# Patient Record
Sex: Female | Born: 1965 | Race: Asian | Hispanic: No | Marital: Married | Smoking: Never smoker
Health system: Southern US, Community
[De-identification: ages and names within clinical notes are randomized; demographics above are authoritative.]

---

## 2008-09-04 ENCOUNTER — Encounter: Admission: RE | Admit: 2008-09-04 | Discharge: 2008-09-04 | Payer: Self-pay | Admitting: Family Medicine

## 2010-09-26 ENCOUNTER — Other Ambulatory Visit: Payer: Self-pay | Admitting: Family Medicine

## 2010-09-26 DIAGNOSIS — Z1231 Encounter for screening mammogram for malignant neoplasm of breast: Secondary | ICD-10-CM

## 2010-09-26 DIAGNOSIS — Z139 Encounter for screening, unspecified: Secondary | ICD-10-CM

## 2010-10-24 ENCOUNTER — Ambulatory Visit: Admission: RE | Admit: 2010-10-24 | Payer: Self-pay | Source: Ambulatory Visit

## 2010-10-24 ENCOUNTER — Ambulatory Visit
Admission: RE | Admit: 2010-10-24 | Discharge: 2010-10-24 | Disposition: A | Discharge status: Home / Self Care | Payer: Managed Care, Other (non HMO) | Source: Ambulatory Visit | Attending: Family Medicine | Admitting: Family Medicine

## 2010-10-24 DIAGNOSIS — Z139 Encounter for screening, unspecified: Secondary | ICD-10-CM

## 2011-10-19 ENCOUNTER — Other Ambulatory Visit: Payer: Self-pay | Admitting: Family Medicine

## 2011-10-19 DIAGNOSIS — Z0000 Encounter for general adult medical examination without abnormal findings: Secondary | ICD-10-CM

## 2011-11-04 ENCOUNTER — Ambulatory Visit
Admission: RE | Admit: 2011-11-04 | Discharge: 2011-11-04 | Disposition: A | Discharge status: Home / Self Care | Payer: Managed Care, Other (non HMO) | Source: Ambulatory Visit | Attending: Family Medicine | Admitting: Family Medicine

## 2011-11-04 DIAGNOSIS — Z0000 Encounter for general adult medical examination without abnormal findings: Secondary | ICD-10-CM

## 2012-10-20 ENCOUNTER — Ambulatory Visit (INDEPENDENT_AMBULATORY_CARE_PROVIDER_SITE_OTHER): Payer: Managed Care, Other (non HMO) | Admitting: Radiology

## 2012-10-20 DIAGNOSIS — Z23 Encounter for immunization: Secondary | ICD-10-CM

## 2012-11-01 ENCOUNTER — Other Ambulatory Visit: Payer: Self-pay | Admitting: Family Medicine

## 2012-11-01 ENCOUNTER — Other Ambulatory Visit: Payer: Self-pay | Admitting: Nurse Practitioner

## 2012-11-01 DIAGNOSIS — Z139 Encounter for screening, unspecified: Secondary | ICD-10-CM

## 2012-11-17 ENCOUNTER — Ambulatory Visit
Admission: RE | Admit: 2012-11-17 | Discharge: 2012-11-17 | Disposition: A | Discharge status: Home / Self Care | Payer: Managed Care, Other (non HMO) | Source: Ambulatory Visit | Attending: Nurse Practitioner | Admitting: Nurse Practitioner

## 2012-11-17 DIAGNOSIS — Z139 Encounter for screening, unspecified: Secondary | ICD-10-CM

## 2012-11-22 ENCOUNTER — Other Ambulatory Visit: Payer: Self-pay | Admitting: Radiology

## 2013-11-02 ENCOUNTER — Other Ambulatory Visit: Payer: Self-pay | Admitting: Family Medicine

## 2013-11-02 DIAGNOSIS — Z0000 Encounter for general adult medical examination without abnormal findings: Secondary | ICD-10-CM

## 2013-11-15 ENCOUNTER — Ambulatory Visit
Admission: RE | Admit: 2013-11-15 | Discharge: 2013-11-15 | Disposition: A | Discharge status: Home / Self Care | Payer: Managed Care, Other (non HMO) | Source: Ambulatory Visit | Attending: Family Medicine | Admitting: Family Medicine

## 2013-11-15 DIAGNOSIS — Z0000 Encounter for general adult medical examination without abnormal findings: Secondary | ICD-10-CM

## 2014-11-12 ENCOUNTER — Other Ambulatory Visit: Payer: Self-pay | Admitting: Family Medicine

## 2014-11-12 DIAGNOSIS — Z139 Encounter for screening, unspecified: Secondary | ICD-10-CM

## 2014-12-26 ENCOUNTER — Ambulatory Visit
Admission: RE | Admit: 2014-12-26 | Discharge: 2014-12-26 | Disposition: A | Discharge status: Home / Self Care | Payer: Managed Care, Other (non HMO) | Source: Ambulatory Visit | Attending: Family Medicine | Admitting: Family Medicine

## 2014-12-26 DIAGNOSIS — Z139 Encounter for screening, unspecified: Secondary | ICD-10-CM

## 2015-09-16 ENCOUNTER — Encounter (HOSPITAL_COMMUNITY): Payer: Self-pay | Admitting: Emergency Medicine

## 2015-09-16 ENCOUNTER — Emergency Department (HOSPITAL_COMMUNITY)
Admission: EM | Admit: 2015-09-16 | Discharge: 2015-09-16 | Disposition: A | Discharge status: Home / Self Care | Payer: Managed Care, Other (non HMO) | Attending: Emergency Medicine | Admitting: Emergency Medicine

## 2015-09-16 DIAGNOSIS — R590 Localized enlarged lymph nodes: Secondary | ICD-10-CM | POA: Insufficient documentation

## 2015-09-16 DIAGNOSIS — R591 Generalized enlarged lymph nodes: Secondary | ICD-10-CM

## 2015-09-16 DIAGNOSIS — H9202 Otalgia, left ear: Secondary | ICD-10-CM | POA: Diagnosis present

## 2015-09-16 NOTE — ED Provider Notes (Signed)
[facility]-EMERGENCY DEPT Provider Note   CSN: [id number] Arrival date & time: 09/16/15  1127  By signing my name below, I, Linna Darner, attest that this documentation has been prepared under the direction and in the presence of non-physician practitioner, Jaynie Crumble, PA-C. Electronically Signed: Linna Darner, Scribe. 09/16/2015. 1:35 PM.  History   Chief Complaint Chief Complaint  Patient presents with  . Otalgia    The history is provided by the patient. No language interpreter was used.     HPI Comments: Cheryl Boyd is a 49 y.o. female who presents to the Emergency Department complaining of sudden onset, constant, pain behind her left ear beginning two days ago. She states she had a fever and cough beginning 5 days ago that resolved 2 days ago; she noticed a bump behind her left ear when these symptoms resolved. Pt states the area is painful and endorses pain exacerbation with palpation. Pt reports her fever was between 99 and 100 at the highest. She has taken tylenol for the pain behind her left ear; her last dose was last night. She denies sore throat, rhinorrhea, left ear pain, trouble swallowing, pain with swallowing, or any other associated symptoms. She notes she has a PCP.  History reviewed. No pertinent past medical history.  There are no active problems to display for this patient.   History reviewed. No pertinent surgical history.  OB History    No data available       Home Medications    Prior to Admission medications   Not on File    Family History No family history on file.  Social History Social History  Substance Use Topics  . Smoking status: Never Smoker  . Smokeless tobacco: Never Used  . Alcohol use 4.8 oz/week    4 Cans of beer, 4 Glasses of wine per week     Allergies   Review of patient's allergies indicates no known allergies.   Review of Systems Review of Systems  Constitutional: Positive for fever (resolved).  HENT:  Negative for ear pain (left), rhinorrhea, sore throat and trouble swallowing.   Respiratory: Positive for cough (resolved).   Musculoskeletal: Positive for arthralgias (behind left ear).    Physical Exam Updated Vital Signs BP 122/81   Pulse 88   Temp 98.6 F (37 C) (Oral)   Resp 18   Ht 5\' 6"  (1.676 m)   Wt 126 lb (57.2 kg)   LMP 09/05/2015   SpO2 100%   BMI 20.34 kg/m   Physical Exam  Constitutional: She is oriented to person, place, and time. She appears well-developed and well-nourished. No distress.  HENT:  Head: Normocephalic and atraumatic.  Eyes: Conjunctivae and EOM are normal.  Neck: Neck supple. No tracheal deviation present.  Mild left posterior cervical lymphadenopathy.   Cardiovascular: Normal rate, regular rhythm and normal heart sounds.   Pulmonary/Chest: Effort normal and breath sounds normal. No respiratory distress. She has no wheezes. She has no rales.  Musculoskeletal: Normal range of motion.  Neurological: She is alert and oriented to person, place, and time.  Skin: Skin is warm and dry.  Psychiatric: She has a normal mood and affect. Her behavior is normal.  Nursing note and vitals reviewed.   ED Treatments / Results  Labs (all labs ordered are listed, but only abnormal results are displayed) Labs Reviewed - No data to display  EKG  EKG Interpretation None       Radiology No results found.  Procedures Procedures (including critical care time)  DIAGNOSTIC STUDIES: Oxygen Saturation is 100% on RA, normal by my interpretation.    COORDINATION OF CARE: 1:35 PM Discussed treatment plan with pt at bedside and pt agreed to plan.  Medications Ordered in ED Medications - No data to display   Initial Impression / Assessment and Plan / ED Course  I have reviewed the triage vital signs and the nursing notes.  Pertinent labs & imaging results that were available during my care of the patient were reviewed by me and considered in my medical  decision making (see chart for details).  Clinical Course   Patient emergency department with pain to the left posterior neck, has some tenderness on exam over posterior cervical chain lymph nodes. Mild enlargement of the lymph nodes noted. Her exam otherwise is unremarkable. Her vital signs are normal. She is afebrile. No neck pain or stiffness otherwise. Patient did have some URI symptoms which now resolved including low-grade fever. Question possible viral upper respiratory tract infection. Instructed patient to keep an eye on the lymph nodes, take NSAIDs, follow with primary care doctor but improving. Pt agreed and voiced understanding.   Vitals:   09/16/15 1140 09/16/15 1141 09/16/15 1349  BP: 122/81  106/68  Pulse: 88  74  Resp: 18  18  Temp: 98.6 F (37 C)  98.7 F (37.1 C)  TempSrc: Oral  Oral  SpO2: 100%  99%  Weight:  57.2 kg   Height:  5\' 6"  (1.676 m)     I personally performed the services described in this documentation, which was scribed in my presence. The recorded information has been reviewed and is accurate.  Final Clinical Impressions(s) / ED Diagnoses   Final diagnoses:  Lymphadenopathy    New Prescriptions There are no discharge medications for this patient.    Jaynie Crumbleatyana Worth Kober, PA-C 09/16/15 1643    Derwood KaplanAnkit Nanavati, MD 09/17/15 [id number]

## 2015-09-16 NOTE — Discharge Instructions (Signed)
Take ibuprofen for pain and inflammation. Follow up with family doctor if not improving in 3-5 days.

## 2015-09-16 NOTE — ED Notes (Signed)
Patient is A & O x4.  She is ambulatory and has no questions regarding discharge instructions.

## 2015-09-16 NOTE — ED Triage Notes (Signed)
Pt reports right earache x 5 days, fever for which she sts took tylenol . Reports body aches and flu like symptoms.

## 2015-12-17 ENCOUNTER — Other Ambulatory Visit: Payer: Self-pay | Admitting: Family Medicine

## 2015-12-17 DIAGNOSIS — Z0000 Encounter for general adult medical examination without abnormal findings: Secondary | ICD-10-CM

## 2015-12-17 DIAGNOSIS — Z9189 Other specified personal risk factors, not elsewhere classified: Secondary | ICD-10-CM

## 2015-12-26 ENCOUNTER — Ambulatory Visit
Admission: RE | Admit: 2015-12-26 | Discharge: 2015-12-26 | Disposition: A | Discharge status: Home / Self Care | Payer: Managed Care, Other (non HMO) | Source: Ambulatory Visit | Attending: Family Medicine | Admitting: Family Medicine

## 2015-12-26 DIAGNOSIS — Z9189 Other specified personal risk factors, not elsewhere classified: Secondary | ICD-10-CM

## 2015-12-26 DIAGNOSIS — Z0000 Encounter for general adult medical examination without abnormal findings: Secondary | ICD-10-CM

## 2016-12-09 ENCOUNTER — Other Ambulatory Visit: Payer: Self-pay | Admitting: Family Medicine

## 2016-12-09 DIAGNOSIS — Z9189 Other specified personal risk factors, not elsewhere classified: Secondary | ICD-10-CM

## 2016-12-31 ENCOUNTER — Other Ambulatory Visit: Payer: Managed Care, Other (non HMO)

## 2017-01-26 ENCOUNTER — Ambulatory Visit
Admission: RE | Admit: 2017-01-26 | Discharge: 2017-01-26 | Disposition: A | Discharge status: Home / Self Care | Payer: Managed Care, Other (non HMO) | Source: Ambulatory Visit | Attending: Family Medicine | Admitting: Family Medicine

## 2017-01-26 ENCOUNTER — Other Ambulatory Visit: Payer: Managed Care, Other (non HMO)

## 2017-01-26 DIAGNOSIS — Z9189 Other specified personal risk factors, not elsewhere classified: Secondary | ICD-10-CM

## 2018-03-16 ENCOUNTER — Ambulatory Visit
Admission: RE | Admit: 2018-03-16 | Discharge: 2018-03-16 | Disposition: A | Discharge status: Home / Self Care | Payer: 59 | Source: Ambulatory Visit | Attending: Emergency Medicine | Admitting: Emergency Medicine

## 2018-03-16 ENCOUNTER — Other Ambulatory Visit: Payer: Self-pay | Admitting: Emergency Medicine

## 2018-03-16 DIAGNOSIS — R52 Pain, unspecified: Secondary | ICD-10-CM

## 2018-03-22 ENCOUNTER — Other Ambulatory Visit: Payer: Self-pay | Admitting: Emergency Medicine

## 2018-03-22 DIAGNOSIS — M25562 Pain in left knee: Secondary | ICD-10-CM

## 2018-03-26 ENCOUNTER — Other Ambulatory Visit: Payer: Self-pay

## 2018-03-26 ENCOUNTER — Ambulatory Visit
Admission: RE | Admit: 2018-03-26 | Discharge: 2018-03-26 | Disposition: A | Discharge status: Home / Self Care | Payer: 59 | Source: Ambulatory Visit | Attending: Emergency Medicine | Admitting: Emergency Medicine

## 2018-03-26 DIAGNOSIS — M25562 Pain in left knee: Secondary | ICD-10-CM

## 2020-02-20 IMAGING — MR MRI OF THE LEFT KNEE WITHOUT CONTRAST
6 series · 38 of 40 positions shown · non-contrast
Comparison: Radiographs 03/16/2018

CLINICAL DATA: Medial left knee pain for 1 month. Pain began after
playing tennis.

EXAM:
MRI OF THE LEFT KNEE WITHOUT CONTRAST
TECHNIQUE: Multiplanar, multisequence MR imaging of the knee was performed. No
intravenous contrast was administered.

[Series 6: T2 fat-sat · axial · left · 4.0mm · 0.50mm/px · z∈[-32,+121]mm · 9 of 36 slices shown (1 of 3)]
[im 1/36]
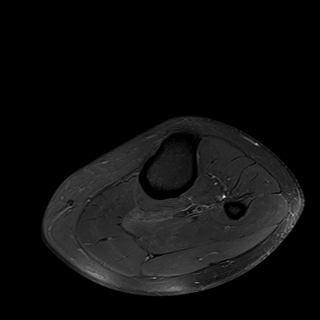
[im 5/36]
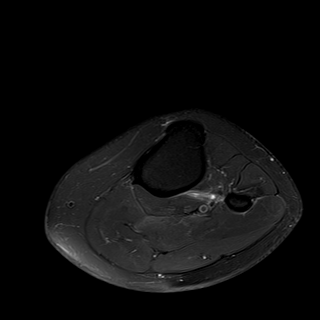
[im 9/36]
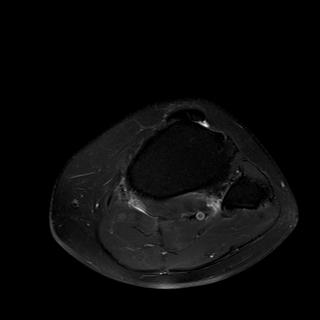
[im 14/36]
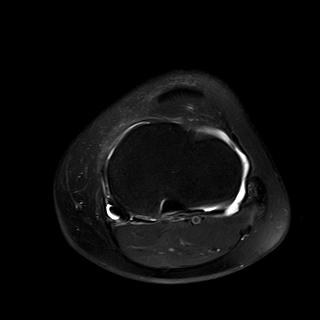
[im 18/36]
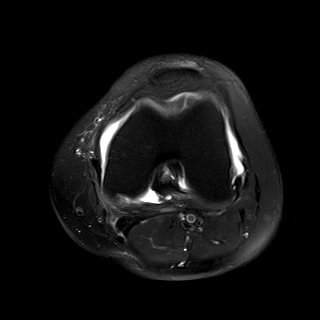
[im 22/36]
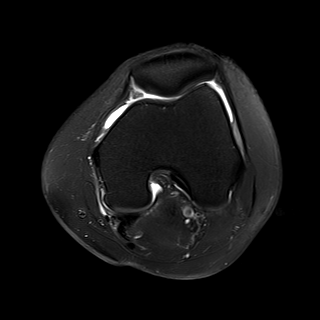
[im 27/36]
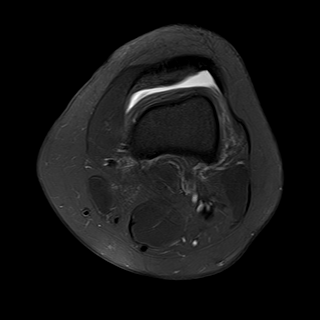
[im 31/36]
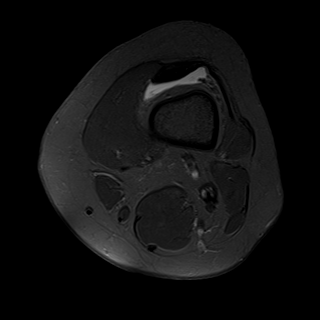
[im 36/36]
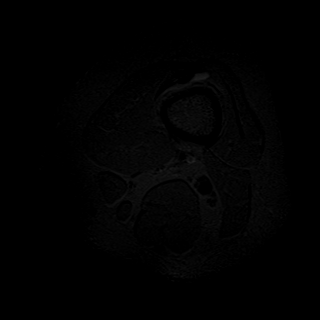

[Series 7: T2 fat-sat · coronal · left · 4.0mm · 0.39mm/px · 6 of 26 slices shown (2 of 3)]
[im 1/26]
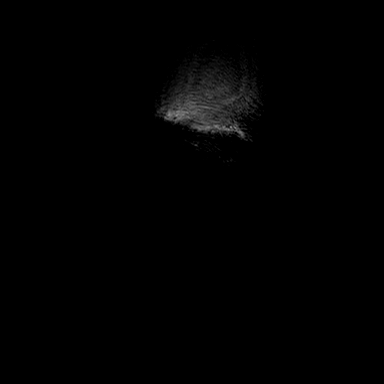
[im 6/26]
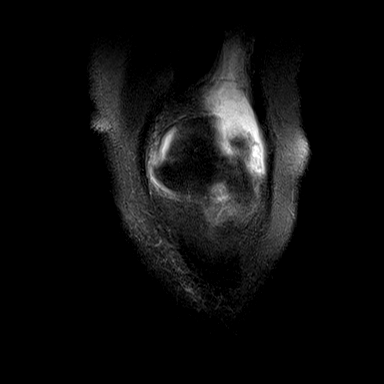
[im 11/26]
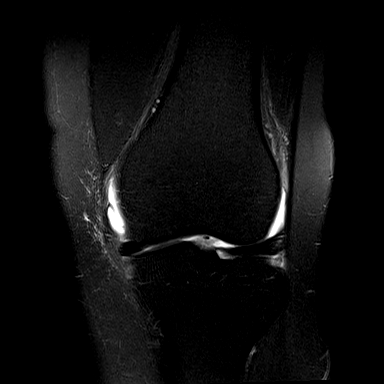
[im 16/26]
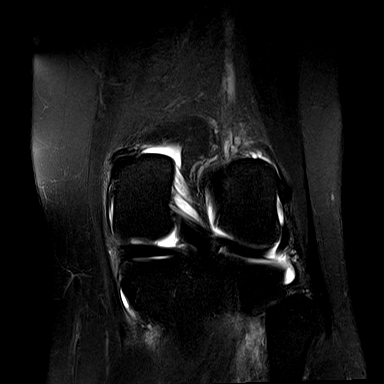
[im 21/26]
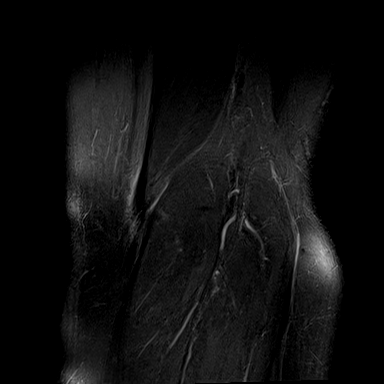
[im 26/26]
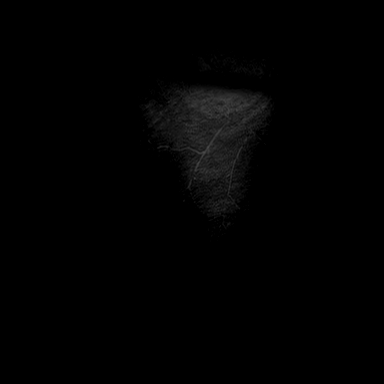

[Series 8: T1 · coronal · left · 4.0mm · 0.39mm/px · 4 of 26 slices shown]
[im 1/26]
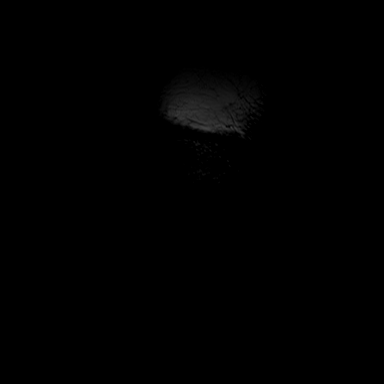
[im 6/26]
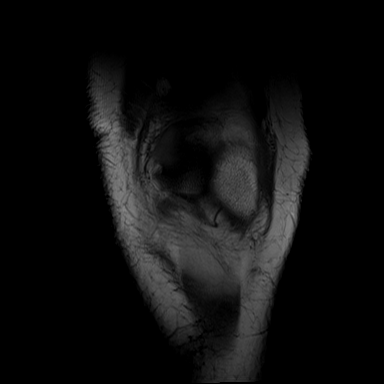
[im 11/26]
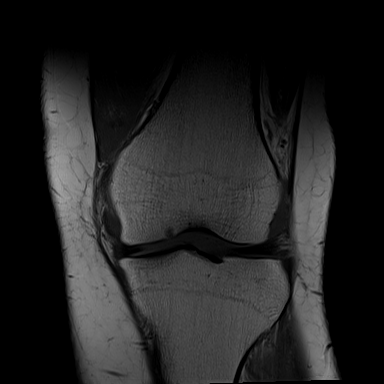
[im 16/26]
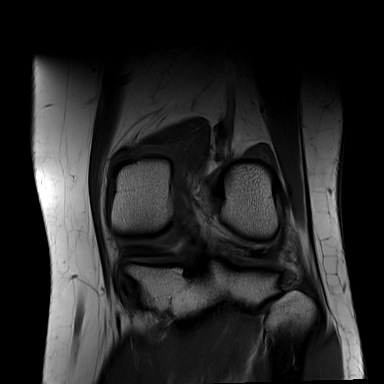

[Series 9: PD fat-sat · coronal · left · 3.0mm · 0.47mm/px · 7 of 30 slices shown (1 of 2)]
[im 1/30]
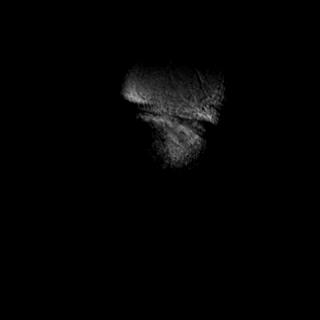
[im 5/30]
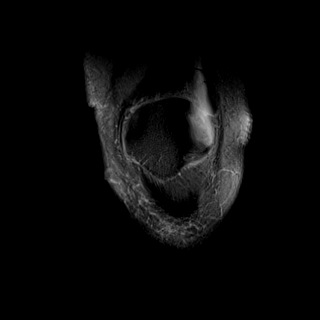
[im 10/30]
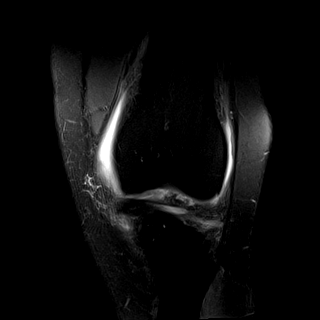
[im 15/30]
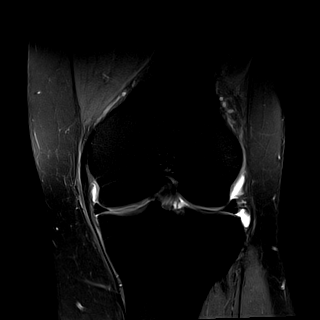
[im 20/30]
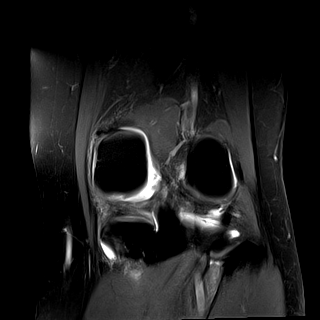
[im 25/30]
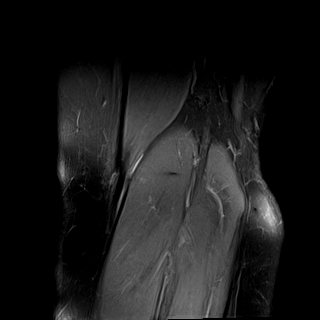
[im 30/30]
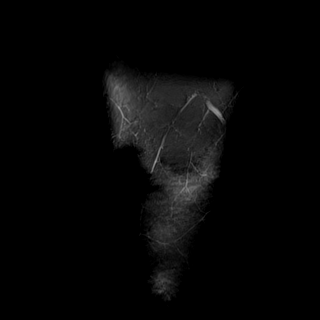

[Series 10: PD fat-sat · sagittal · left · 3.0mm · 0.39mm/px · 6 of 27 slices shown (2 of 2)]
[im 1/27]
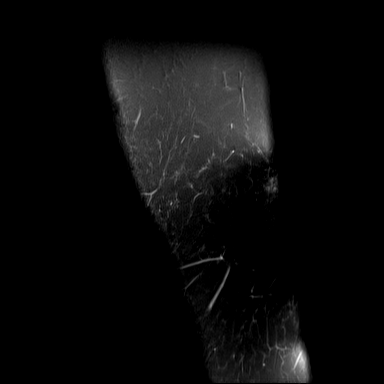
[im 6/27]
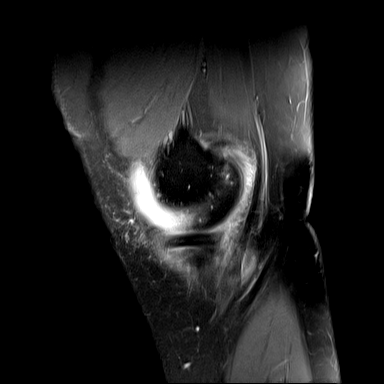
[im 11/27]
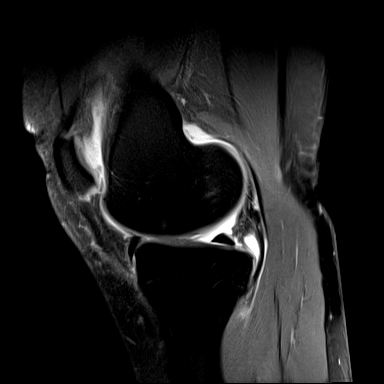
[im 16/27]
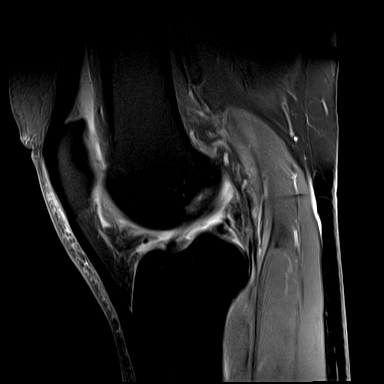
[im 21/27]
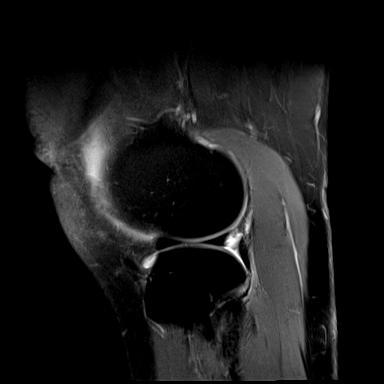
[im 27/27]
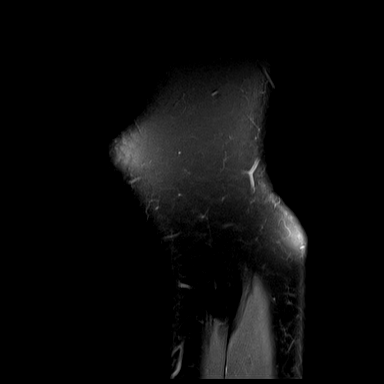

[Series 11: T2 fat-sat · sagittal · left · 3.0mm · 0.39mm/px · 6 of 27 slices shown (3 of 3)]
[im 1/27]
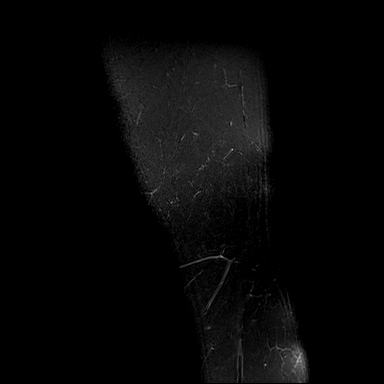
[im 6/27]
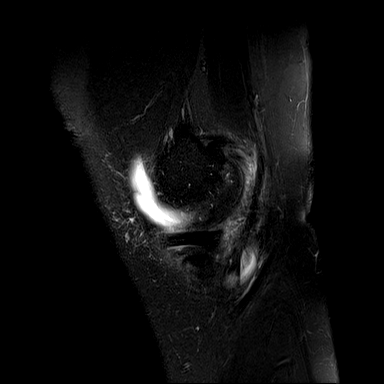
[im 11/27]
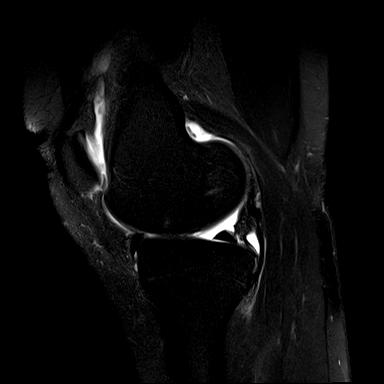
[im 16/27]
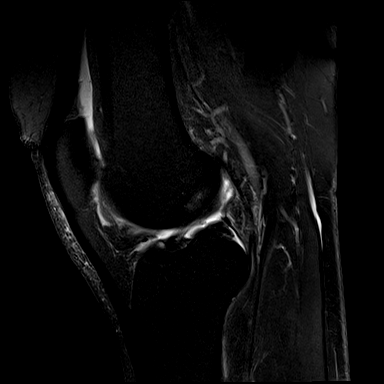
[im 21/27]
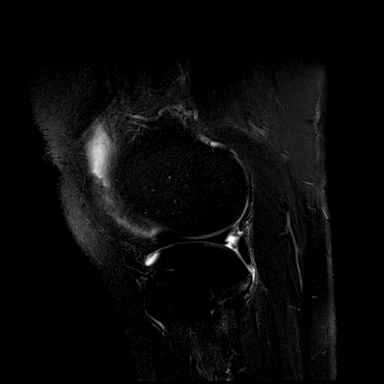
[im 27/27]
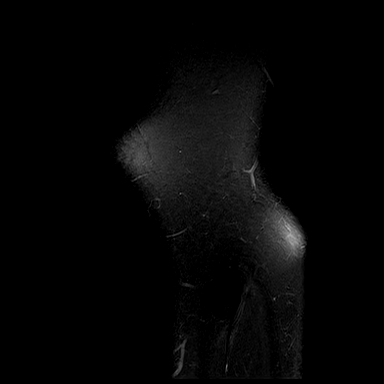

[38 of 40 positions shown; findings below may reference images not displayed]

FINDINGS: MENISCI

Medial meniscus:  Intact

Lateral meniscus:  Intact

LIGAMENTS

Cruciates:  Intact

Collaterals:  Intact

CARTILAGE

Patellofemoral: Mild degenerative chondrosis with probable small
chondral tear/fissure mainly at the patellar apex.

Medial: There is a horizontal flap type chondral tear involving the
medial femoral condyle articular cartilage best seen on series 9,
image 15. The tear is approximately 9 mm in length. The tibial
articular cartilage appears normal.

Lateral:  Normal

Joint:  Small to moderate-sized joint effusion.

Popliteal Fossa:  Small Baker's cyst.

Extensor Mechanism: The patella retinacular structures are intact
and the quadriceps and patellar tendons are intact.

Bones:  No acute bony findings.

Other: Normal knee musculature.
IMPRESSION: 1. Horizontal chondral tear involving the medial femoral condyle
articular cartilage.
2. Mild patellofemoral degenerative chondrosis with a small chondral
tear/fissure involving the patellar apex.
3. Intact ligamentous structures and no meniscal tears.
4. Small to moderate-sized joint effusion and small Baker's cyst.

## 2020-08-29 ENCOUNTER — Other Ambulatory Visit: Payer: Self-pay | Admitting: Physician Assistant

## 2020-08-29 ENCOUNTER — Other Ambulatory Visit: Payer: Self-pay | Admitting: Endocrinology

## 2020-08-29 DIAGNOSIS — Z85028 Personal history of other malignant neoplasm of stomach: Secondary | ICD-10-CM

## 2020-09-06 ENCOUNTER — Other Ambulatory Visit: Payer: 59

## 2020-09-06 ENCOUNTER — Ambulatory Visit
Admission: RE | Admit: 2020-09-06 | Discharge: 2020-09-06 | Disposition: A | Discharge status: Home / Self Care | Payer: Commercial Managed Care - PPO | Source: Ambulatory Visit | Attending: Endocrinology | Admitting: Endocrinology

## 2020-09-06 ENCOUNTER — Other Ambulatory Visit: Payer: Self-pay

## 2020-09-06 DIAGNOSIS — Z85028 Personal history of other malignant neoplasm of stomach: Secondary | ICD-10-CM
